# Patient Record
Sex: Female | Born: 1945 | Race: White | Hispanic: No | Smoking: Never smoker
Health system: Southern US, Community
[De-identification: ages and names within clinical notes are randomized; demographics above are authoritative.]

## PROBLEM LIST (undated history)

## (undated) DIAGNOSIS — K219 Gastro-esophageal reflux disease without esophagitis: Secondary | ICD-10-CM

## (undated) DIAGNOSIS — I10 Essential (primary) hypertension: Secondary | ICD-10-CM

## (undated) DIAGNOSIS — E785 Hyperlipidemia, unspecified: Secondary | ICD-10-CM

## (undated) HISTORY — PX: TONSILECTOMY/ADENOIDECTOMY WITH MYRINGOTOMY: SHX6125

## (undated) HISTORY — PX: ABDOMINAL HYSTERECTOMY: SHX81

## (undated) HISTORY — DX: Hyperlipidemia, unspecified: E78.5

## (undated) HISTORY — PX: CHOLECYSTECTOMY, LAPAROSCOPIC: SHX56

## (undated) HISTORY — DX: Essential (primary) hypertension: I10

## (undated) HISTORY — DX: Gastro-esophageal reflux disease without esophagitis: K21.9

---

## 2006-10-16 ENCOUNTER — Encounter: Admission: RE | Admit: 2006-10-16 | Discharge: 2006-10-16 | Payer: Self-pay | Admitting: *Deleted

## 2006-10-26 ENCOUNTER — Encounter: Admission: RE | Admit: 2006-10-26 | Discharge: 2006-10-26 | Payer: Self-pay

## 2007-05-16 ENCOUNTER — Encounter: Admission: RE | Admit: 2007-05-16 | Discharge: 2007-05-16 | Payer: Self-pay | Admitting: *Deleted

## 2007-05-26 ENCOUNTER — Encounter: Admission: RE | Admit: 2007-05-26 | Discharge: 2007-05-26 | Payer: Self-pay | Admitting: *Deleted

## 2008-08-05 ENCOUNTER — Encounter: Admission: RE | Admit: 2008-08-05 | Discharge: 2008-08-05 | Payer: Self-pay | Admitting: Family Medicine

## 2009-09-22 ENCOUNTER — Encounter: Admission: RE | Admit: 2009-09-22 | Discharge: 2009-09-22 | Payer: Self-pay | Admitting: Family Medicine

## 2010-04-19 ENCOUNTER — Encounter: Payer: Self-pay | Admitting: *Deleted

## 2010-10-14 ENCOUNTER — Other Ambulatory Visit: Payer: Self-pay | Admitting: Family Medicine

## 2010-10-14 DIAGNOSIS — Z1231 Encounter for screening mammogram for malignant neoplasm of breast: Secondary | ICD-10-CM

## 2010-10-26 ENCOUNTER — Ambulatory Visit
Admission: RE | Admit: 2010-10-26 | Discharge: 2010-10-26 | Disposition: A | Discharge status: Home / Self Care | Payer: BC Managed Care – PPO | Source: Ambulatory Visit | Attending: Family Medicine | Admitting: Family Medicine

## 2010-10-26 DIAGNOSIS — Z1231 Encounter for screening mammogram for malignant neoplasm of breast: Secondary | ICD-10-CM

## 2011-11-16 ENCOUNTER — Other Ambulatory Visit: Payer: Self-pay | Admitting: Family Medicine

## 2011-11-17 ENCOUNTER — Other Ambulatory Visit: Payer: Self-pay | Admitting: Family Medicine

## 2011-11-17 DIAGNOSIS — Z1231 Encounter for screening mammogram for malignant neoplasm of breast: Secondary | ICD-10-CM

## 2011-12-14 ENCOUNTER — Ambulatory Visit
Admission: RE | Admit: 2011-12-14 | Discharge: 2011-12-14 | Disposition: A | Discharge status: Home / Self Care | Payer: BC Managed Care – PPO | Source: Ambulatory Visit | Attending: Family Medicine | Admitting: Family Medicine

## 2011-12-14 DIAGNOSIS — Z1231 Encounter for screening mammogram for malignant neoplasm of breast: Secondary | ICD-10-CM

## 2012-12-25 ENCOUNTER — Other Ambulatory Visit: Payer: Self-pay

## 2012-12-25 DIAGNOSIS — Z1231 Encounter for screening mammogram for malignant neoplasm of breast: Secondary | ICD-10-CM

## 2013-01-16 ENCOUNTER — Ambulatory Visit
Admission: RE | Admit: 2013-01-16 | Discharge: 2013-01-16 | Disposition: A | Discharge status: Home / Self Care | Payer: BC Managed Care – PPO | Source: Ambulatory Visit

## 2013-01-16 DIAGNOSIS — Z1231 Encounter for screening mammogram for malignant neoplasm of breast: Secondary | ICD-10-CM

## 2013-01-22 ENCOUNTER — Other Ambulatory Visit: Payer: Self-pay | Admitting: Family Medicine

## 2013-01-22 DIAGNOSIS — R928 Other abnormal and inconclusive findings on diagnostic imaging of breast: Secondary | ICD-10-CM

## 2013-02-09 ENCOUNTER — Ambulatory Visit
Admission: RE | Admit: 2013-02-09 | Discharge: 2013-02-09 | Disposition: A | Discharge status: Home / Self Care | Payer: Medicare Other | Source: Ambulatory Visit | Attending: Family Medicine | Admitting: Family Medicine

## 2013-02-09 DIAGNOSIS — R928 Other abnormal and inconclusive findings on diagnostic imaging of breast: Secondary | ICD-10-CM

## 2013-08-14 ENCOUNTER — Other Ambulatory Visit: Payer: Self-pay | Admitting: Family Medicine

## 2013-08-14 DIAGNOSIS — D241 Benign neoplasm of right breast: Secondary | ICD-10-CM

## 2013-08-24 ENCOUNTER — Ambulatory Visit
Admission: RE | Admit: 2013-08-24 | Discharge: 2013-08-24 | Disposition: A | Discharge status: Home / Self Care | Payer: Medicare Other | Source: Ambulatory Visit | Attending: Family Medicine | Admitting: Family Medicine

## 2013-08-24 ENCOUNTER — Encounter (INDEPENDENT_AMBULATORY_CARE_PROVIDER_SITE_OTHER): Payer: Self-pay

## 2013-08-24 DIAGNOSIS — D241 Benign neoplasm of right breast: Secondary | ICD-10-CM

## 2014-01-08 ENCOUNTER — Other Ambulatory Visit: Payer: Self-pay | Admitting: Family Medicine

## 2014-01-08 DIAGNOSIS — R921 Mammographic calcification found on diagnostic imaging of breast: Secondary | ICD-10-CM

## 2014-01-22 ENCOUNTER — Ambulatory Visit
Admission: RE | Admit: 2014-01-22 | Discharge: 2014-01-22 | Disposition: A | Discharge status: Home / Self Care | Payer: Medicare Other | Source: Ambulatory Visit | Attending: Family Medicine | Admitting: Family Medicine

## 2014-01-22 DIAGNOSIS — R921 Mammographic calcification found on diagnostic imaging of breast: Secondary | ICD-10-CM

## 2015-01-14 ENCOUNTER — Other Ambulatory Visit: Payer: Self-pay

## 2015-01-14 DIAGNOSIS — Z1231 Encounter for screening mammogram for malignant neoplasm of breast: Secondary | ICD-10-CM

## 2015-02-10 ENCOUNTER — Ambulatory Visit
Admission: RE | Admit: 2015-02-10 | Discharge: 2015-02-10 | Disposition: A | Discharge status: Home / Self Care | Payer: Medicare Other | Source: Ambulatory Visit

## 2015-02-10 DIAGNOSIS — Z1231 Encounter for screening mammogram for malignant neoplasm of breast: Secondary | ICD-10-CM

## 2016-01-23 ENCOUNTER — Other Ambulatory Visit: Payer: Self-pay | Admitting: Family Medicine

## 2016-01-23 DIAGNOSIS — Z1231 Encounter for screening mammogram for malignant neoplasm of breast: Secondary | ICD-10-CM

## 2016-02-23 ENCOUNTER — Ambulatory Visit
Admission: RE | Admit: 2016-02-23 | Discharge: 2016-02-23 | Disposition: A | Discharge status: Home / Self Care | Payer: Medicare Other | Source: Ambulatory Visit | Attending: Family Medicine | Admitting: Family Medicine

## 2016-02-23 DIAGNOSIS — Z1231 Encounter for screening mammogram for malignant neoplasm of breast: Secondary | ICD-10-CM

## 2017-01-20 ENCOUNTER — Other Ambulatory Visit: Payer: Self-pay | Admitting: Family Medicine

## 2017-01-20 DIAGNOSIS — Z1231 Encounter for screening mammogram for malignant neoplasm of breast: Secondary | ICD-10-CM

## 2017-03-02 ENCOUNTER — Ambulatory Visit
Admission: RE | Admit: 2017-03-02 | Discharge: 2017-03-02 | Disposition: A | Discharge status: Home / Self Care | Payer: Medicare Other | Source: Ambulatory Visit | Attending: Family Medicine | Admitting: Family Medicine

## 2017-03-02 DIAGNOSIS — Z1231 Encounter for screening mammogram for malignant neoplasm of breast: Secondary | ICD-10-CM

## 2018-02-01 ENCOUNTER — Other Ambulatory Visit: Payer: Self-pay | Admitting: Family Medicine

## 2018-02-01 DIAGNOSIS — Z1231 Encounter for screening mammogram for malignant neoplasm of breast: Secondary | ICD-10-CM

## 2018-02-13 ENCOUNTER — Other Ambulatory Visit: Payer: Self-pay | Admitting: Family Medicine

## 2018-02-13 DIAGNOSIS — N644 Mastodynia: Secondary | ICD-10-CM

## 2018-03-06 ENCOUNTER — Ambulatory Visit: Payer: Medicare Other

## 2018-03-06 ENCOUNTER — Ambulatory Visit
Admission: RE | Admit: 2018-03-06 | Discharge: 2018-03-06 | Disposition: A | Discharge status: Home / Self Care | Payer: Medicare Other | Source: Ambulatory Visit | Attending: Family Medicine | Admitting: Family Medicine

## 2018-03-06 DIAGNOSIS — N644 Mastodynia: Secondary | ICD-10-CM

## 2019-04-09 ENCOUNTER — Other Ambulatory Visit: Payer: Self-pay | Admitting: Family Medicine

## 2019-04-09 DIAGNOSIS — Z1231 Encounter for screening mammogram for malignant neoplasm of breast: Secondary | ICD-10-CM

## 2019-04-10 ENCOUNTER — Other Ambulatory Visit: Payer: Self-pay

## 2019-04-10 ENCOUNTER — Ambulatory Visit
Admission: RE | Admit: 2019-04-10 | Discharge: 2019-04-10 | Disposition: A | Discharge status: Home / Self Care | Payer: Medicare Other | Source: Ambulatory Visit | Attending: Family Medicine | Admitting: Family Medicine

## 2019-04-10 DIAGNOSIS — Z1231 Encounter for screening mammogram for malignant neoplasm of breast: Secondary | ICD-10-CM

## 2020-10-16 ENCOUNTER — Other Ambulatory Visit: Payer: Self-pay | Admitting: Family Medicine

## 2020-10-16 DIAGNOSIS — Z1231 Encounter for screening mammogram for malignant neoplasm of breast: Secondary | ICD-10-CM

## 2021-01-14 ENCOUNTER — Ambulatory Visit
Admission: RE | Admit: 2021-01-14 | Discharge: 2021-01-14 | Disposition: A | Discharge status: Home / Self Care | Payer: Medicare Other | Source: Ambulatory Visit | Attending: Family Medicine | Admitting: Family Medicine

## 2021-01-14 ENCOUNTER — Other Ambulatory Visit: Payer: Self-pay

## 2021-01-14 DIAGNOSIS — Z1231 Encounter for screening mammogram for malignant neoplasm of breast: Secondary | ICD-10-CM

## 2021-12-22 ENCOUNTER — Encounter: Payer: Self-pay | Admitting: Gastroenterology

## 2021-12-22 ENCOUNTER — Ambulatory Visit (INDEPENDENT_AMBULATORY_CARE_PROVIDER_SITE_OTHER): Payer: Medicare Other | Admitting: Gastroenterology

## 2021-12-22 VITALS — BP 122/70 | HR 71 | Ht 65.5 in | Wt 162.6 lb

## 2021-12-22 DIAGNOSIS — Z1211 Encounter for screening for malignant neoplasm of colon: Secondary | ICD-10-CM | POA: Diagnosis not present

## 2021-12-22 DIAGNOSIS — Z1212 Encounter for screening for malignant neoplasm of rectum: Secondary | ICD-10-CM

## 2021-12-22 DIAGNOSIS — R151 Fecal smearing: Secondary | ICD-10-CM | POA: Diagnosis not present

## 2021-12-22 DIAGNOSIS — R1310 Dysphagia, unspecified: Secondary | ICD-10-CM | POA: Diagnosis not present

## 2021-12-22 DIAGNOSIS — K219 Gastro-esophageal reflux disease without esophagitis: Secondary | ICD-10-CM

## 2021-12-22 MED ORDER — NA SULFATE-K SULFATE-MG SULF 17.5-3.13-1.6 GM/177ML PO SOLN: 1.0000 | Freq: Once | ORAL | 0 refills | Status: AC

## 2021-12-22 NOTE — Patient Instructions (Signed)
If you are age 74 or older, your body mass index should be between 23-30. Your Body mass index is 26.65 kg/m. If this is out of the aforementioned range listed, please consider follow up with your Primary Care Provider.  If you are age 89 or younger, your body mass index should be between 19-25. Your Body mass index is 26.65 kg/m. If this is out of the aformentioned range listed, please consider follow up with your Primary Care Provider.   Please purchase Metamucil over the counter. Take as directed.   You have been scheduled for an endoscopy and colonoscopy. Please follow the written instructions given to you at your visit today. Please pick up your prep supplies at the pharmacy within the next 1-3 days. If you use inhalers (even only as needed), please bring them with you on the day of your procedure.   The [facility] providers would like to encourage you to use [facility] to communicate with providers for non-urgent requests or questions.  Due to long hold times on the telephone, sending your provider a message by [facility] may be a faster and more efficient way to get a response.  Please allow 48 business hours for a response.  Please remember that this is for non-urgent requests.   It was a pleasure to see you today!  Thank you for trusting me with your gastrointestinal care!    Scott E.cunningham

## 2021-12-22 NOTE — Progress Notes (Unsigned)
HPI : Christine Rhodes is a very pleasant 76 year old female with a history of hypertension and hyperlipidemia who is referred to [place] by Dr. Earney Mallet for further evaluation of dysphagia.  Patient states that she has been having dysphagia for about 2 to 3 years now.  She describes her dysphagia as episodes of coughing and choking when she swallows.  This is noticed more so with liquids than solids.  She denies a sensation of food getting stuck in her chest and having to wash it down.  She denies any episodes of feeling that food was stuck and having to forcefully vomit back up. She underwent a swallow evaluation in 2020 which showed mild penetration with thin liquids, mild delay in laryngeal closure and duration of elevation and mild residue on tongue base and posterior pharyngeal wall.  She was given exercises to perform to improve her swallowing, but she admits she was not very diligent about doing these exercises. She also has a history of chronic typical GERD symptoms which are well controlled with once daily omeprazole.  She states that if she misses a dose she will have significant heartburn.  She has been on omeprazole for about 20 years.  She describes regurgitation events at night, resulting in aspiration-like symptoms (severe coughing, unable to breathe).  She has noticed this particularly when she sleeps on her right side, she does not she does not sleep on her right side anymore.  She reports having 2 colonoscopies in the past, at age 89 and again at age 29.  These colonoscopies were performed in [facility] by Dr. Docia Furl and Dr. Leta Speller and were normal per the patient.  She tells me she was told she did not need any further colon cancer screening.  She reports having regular bowel movements, typically 2-3 stools per day.  Stools are typically formed, sometimes soft.  No problems with diarrhea, constipation or straining.  No blood in the stool.  She does have issues with seepage sometimes.   This is manifested by the finding of small amounts of liquid stool in her underwear.   Past Medical History:  Diagnosis Date   GERD (gastroesophageal reflux disease)    Hyperlipidemia    Hypertension      Past Surgical History:  Procedure Laterality Date   ABDOMINAL HYSTERECTOMY     CHOLECYSTECTOMY, LAPAROSCOPIC     TONSILECTOMY/ADENOIDECTOMY WITH MYRINGOTOMY     Family History  Problem Relation Age of Onset   Esophageal cancer Brother    Colon cancer Neg Hx    Liver cancer Neg Hx    Pancreatic cancer Neg Hx    Rectal cancer Neg Hx    Stomach cancer Neg Hx    Social History   Tobacco Use   Smoking status: Never   Smokeless tobacco: Never  Substance Use Topics   Alcohol use: Never   Drug use: Never   Current Outpatient Medications  Medication Sig Dispense Refill   aspirin 81 MG chewable tablet Chew by mouth daily.     atorvastatin (LIPITOR) 20 MG tablet Take 20 mg by mouth daily.     lisinopril (ZESTRIL) 20 MG tablet Take 20 mg by mouth daily.     omeprazole (PRILOSEC) 40 MG capsule Take 40 mg by mouth daily.     VITAMIN D, CHOLECALCIFEROL, PO Take 1 tablet by mouth daily.     No current facility-administered medications for this visit.   Allergies  Allergen Reactions   Bactrim [Sulfamethoxazole-Trimethoprim]     Pt reports  that she feels like her throat closes up   Penicillins Hives   Codeine Rash     Review of Systems: All systems reviewed and negative except where noted in HPI.    No results found.  Physical Exam: BP 122/70 (BP Location: Left Arm, Patient Position: Sitting, Cuff Size: Normal)   Pulse 71   Ht 5' 5.5" (1.664 m)   Wt 162 lb 9.6 oz (73.8 kg)   SpO2 98%   BMI 26.65 kg/m  Constitutional: Pleasant,well-developed, Caucasian female in no acute distress. HEENT: Normocephalic and atraumatic. Conjunctivae are normal. No scleral icterus. Neck supple.  Cardiovascular: Normal rate, regular rhythm.  Pulmonary/chest: Effort normal and breath  sounds normal. No wheezing, rales or rhonchi. Abdominal: Soft, nondistended, nontender. Bowel sounds active throughout. There are no masses palpable. No hepatomegaly. Extremities: no edema Lymphadenopathy: No cervical adenopathy noted. Neurological: Alert and oriented to person place and time. Skin: Skin is warm and dry. No rashes noted. Psychiatric: Normal mood and affect. Behavior is normal.  CBC No results found for: "WBC", "RBC", "HGB", "HCT", "PLT", "MCV", "MCH", "MCHC", "RDW", "LYMPHSABS", "MONOABS", "EOSABS", "BASOSABS"  CMP  No results found for: "NA", "K", "CL", "CO2", "GLUCOSE", "BUN", "CREATININE", "CALCIUM", "PROT", "ALBUMIN", "AST", "ALT", "ALKPHOS", "BILITOT", "GFRNONAA", "GFRAA"   ASSESSMENT AND PLAN: 76 year old female with hypertension, hyperlipidemia and chronic GERD symptoms, with 2 to 3 years of dysphagia symptoms.  Her description of dysphagia seems more consistent with an oropharyngeal dysphagia rather than an esophageal dysphagia.  She has already undergone a swallow evaluation, and had some mild abnormalities that would probably explain her symptoms (penetration with thin liquids).  She was given exercises to perform, but admits that she has not been diligent about these exercises.  I do not think any further evaluation of this dysphagia is needed at this point. She does have chronic GERD symptoms which are mostly well controlled with once daily Prilosec, which turned if she misses even 1 dose.  She has never had an upper endoscopy.  Given the chronicity of her GERD symptoms and her dysphagia (even though it seems most consistent with oropharyngeal) I think an upper endoscopy is reasonable to assess her anatomy and for complications of GERD, and perform dilation if a ring or stricture is present. The patient is also overdue for a screening colonoscopy.  Her last colonoscopy was 12 years ago and was reportedly normal by the patient.  Although she was told that she did not  need any further colon cancer screening, the patient does not have any significant cardiopulmonary comorbidities and has very good functional status.  She has a reasonable life expectancy of 10 years or more.  I feel that she would be a good candidate for 1 more screening colonoscopy.  If this one is normal, then I would recommend against further colon cancer screening after that.  This is also in accordance with guidelines which suggest screening until age 51 for most patients. The patient has symptoms of fecal seepage.  This is most likely related to age-related atrophy and weakness of sphincter muscle.  She does not have other symptoms such as hemorrhoid prolapse.  I recommended she start taking Metamucil on a daily basis.  We will assess for other abnormalities such as prolapsing hemorrhoids at the time of her colonoscopy.   CRC screening -Colonoscopy  GERD - Omeprazole daily - EGD  Dysphagia, more consistent with oropharyngeal dysphagia - EGD - Recommended patient perform swallowing exercises as previously recommended by speech path  Fecal seepage -  Metamucil   The details, risks (including bleeding, perforation, infection, missed lesions, medication reactions and possible hospitalization or surgery if complications occur), benefits, and alternatives to EGD/colonoscopy with possible biopsy and possible polypectomy were discussed with the patient and she consents to proceed.    Earney Mallet, MD

## 2021-12-28 ENCOUNTER — Telehealth: Payer: Self-pay | Admitting: Gastroenterology

## 2021-12-28 ENCOUNTER — Other Ambulatory Visit: Payer: Self-pay

## 2021-12-28 MED ORDER — NA SULFATE-K SULFATE-MG SULF 17.5-3.13-1.6 GM/177ML PO SOLN: 1.0000 | Freq: Once | ORAL | 0 refills | Status: AC

## 2021-12-28 NOTE — Telephone Encounter (Signed)
Patient called regarding her prep medication, states she went to the pharmacy and they did not have the order. Please send it and call her once done so she knows.

## 2021-12-28 NOTE — Telephone Encounter (Signed)
Left VM for patient to return my call

## 2021-12-29 ENCOUNTER — Other Ambulatory Visit: Payer: Self-pay

## 2021-12-29 MED ORDER — NA SULFATE-K SULFATE-MG SULF 17.5-3.13-1.6 GM/177ML PO SOLN: 1.0000 | Freq: Once | ORAL | 0 refills | Status: AC

## 2021-12-29 NOTE — Telephone Encounter (Signed)
Suprep has been sent to [facility] in [place] , [place] per patient request.

## 2021-12-29 NOTE — Telephone Encounter (Signed)
Inbound call from patient stating that she would like her prep sent to [facility] in [place]. Please advise  Pharmacy Number : [phone]

## 2022-01-14 ENCOUNTER — Ambulatory Visit (AMBULATORY_SURGERY_CENTER): Payer: Medicare Other | Admitting: Gastroenterology

## 2022-01-14 ENCOUNTER — Encounter: Payer: Self-pay | Admitting: Gastroenterology

## 2022-01-14 VITALS — BP 109/86 | HR 62 | Temp 97.5°F | Resp 12 | Ht 65.5 in | Wt 162.0 lb

## 2022-01-14 DIAGNOSIS — K219 Gastro-esophageal reflux disease without esophagitis: Secondary | ICD-10-CM

## 2022-01-14 DIAGNOSIS — K317 Polyp of stomach and duodenum: Secondary | ICD-10-CM

## 2022-01-14 DIAGNOSIS — Z1211 Encounter for screening for malignant neoplasm of colon: Secondary | ICD-10-CM

## 2022-01-14 DIAGNOSIS — K449 Diaphragmatic hernia without obstruction or gangrene: Secondary | ICD-10-CM

## 2022-01-14 DIAGNOSIS — Z1212 Encounter for screening for malignant neoplasm of rectum: Secondary | ICD-10-CM | POA: Diagnosis not present

## 2022-01-14 DIAGNOSIS — R1310 Dysphagia, unspecified: Secondary | ICD-10-CM

## 2022-01-14 MED ORDER — SODIUM CHLORIDE 0.9 % IV SOLN: 500.0000 mL | Freq: Once | INTRAVENOUS | Status: DC

## 2022-01-14 NOTE — Progress Notes (Signed)
History and Physical Interval Note:  01/14/2022 3:42 PM  Christine Rhodes  has presented today for endoscopic procedure(s), with the diagnosis of  Encounter Diagnoses  Name Primary?   Dysphagia, unspecified type Yes   Gastroesophageal reflux disease, unspecified whether esophagitis present    Screening for colorectal cancer   .  The various methods of evaluation and treatment have been discussed with the patient and/or family. After consideration of risks, benefits and other options for treatment, the patient has consented to  the endoscopic procedure(s).   The patient's history has been reviewed, patient examined, no change in status, stable for endoscopic procedure(s).  I have reviewed the patient's chart and labs.  Questions were answered to the patient's satisfaction.     Oralee Rapaport E. Candis Schatz, MD [facility]

## 2022-01-14 NOTE — Progress Notes (Signed)
Called to room to assist during endoscopic procedure.  Patient ID and intended procedure confirmed with present staff. Received instructions for my participation in the procedure from the performing physician.  

## 2022-01-14 NOTE — Progress Notes (Signed)
Pt in recovery with monitors in place, VSS. Report given to receiving RN. Bite guard was placed with pt awake to ensure comfort. No dental or soft tissue damage noted. 

## 2022-01-14 NOTE — Op Note (Signed)
[facility] Patient Name: Christine Rhodes Procedure Date: 01/14/2022 3:47 PM MRN: [id number] Endoscopist: Nicki Reaper E. Candis Schatz , MD Age: 76 Referring MD:  Date of Birth: 02-Apr-1945 Gender: Female Account #: [id number] Procedure:                Colonoscopy Indications:              Screening for colorectal malignant neoplasm (last                            colonoscopy was more than 10 years ago) Medicines:                Monitored Anesthesia Care Procedure:                Pre-Anesthesia Assessment:                           - Prior to the procedure, a History and Physical                            was performed, and patient medications and                            allergies were reviewed. The patient's tolerance of                            previous anesthesia was also reviewed. The risks                            and benefits of the procedure and the sedation                            options and risks were discussed with the patient.                            All questions were answered, and informed consent                            was obtained. Prior Anticoagulants: The patient has                            taken no previous anticoagulant or antiplatelet                            agents. ASA Grade Assessment: II - A patient with                            mild systemic disease. After reviewing the risks                            and benefits, the patient was deemed in                            satisfactory condition to undergo the procedure.  After obtaining informed consent, the colonoscope                            was passed under direct vision. Throughout the                            procedure, the patient's blood pressure, pulse, and                            oxygen saturations were monitored continuously. The                            CF [id number] #[id number] was introduced through the anus                            and advanced to  the the cecum, identified by                            appendiceal orifice and ileocecal valve. The                            colonoscopy was somewhat difficult due to                            significant looping and a tortuous colon.                            Successful completion of the procedure was aided by                            using manual pressure. The patient tolerated the                            procedure well. The quality of the bowel                            preparation was excellent. The ileocecal valve,                            appendiceal orifice, and rectum were photographed.                            The bowel preparation used was SUPREP via split                            dose instruction. Scope In: 4:03:23 PM Scope Out: 4:20:13 PM Total Procedure Duration: 0 hours 16 minutes 50 seconds  Findings:                 The perianal and digital rectal examinations were                            normal. Pertinent negatives include normal  sphincter tone and no palpable rectal lesions.                           A single small angiodysplastic lesion without                            bleeding was found in the ascending colon.                           Multiple small and large-mouthed diverticula were                            found in the sigmoid colon, descending colon and                            transverse colon. There was no evidence of                            diverticular bleeding.                           The exam was otherwise normal throughout the                            examined colon.                           The retroflexed view of the distal rectum and anal                            verge was normal and showed no anal or rectal                            abnormalities. Complications:            No immediate complications. Estimated Blood Loss:     Estimated blood loss: none. Impression:               - A single  non-bleeding colonic angiodysplastic                            lesion.                           - Moderate diverticulosis in the sigmoid colon, in                            the descending colon and in the transverse colon.                            There was no evidence of diverticular bleeding.                           - The distal rectum and anal verge are normal on  retroflexion view.                           - No specimens collected. Recommendation:           - Patient has a contact number available for                            emergencies. The signs and symptoms of potential                            delayed complications were discussed with the                            patient. Return to normal activities tomorrow.                            Written discharge instructions were provided to the                            patient.                           - Resume previous diet.                           - Continue present medications.                           - Await pathology results.                           - Given age and lack of polyps, I recommend against                            any further colon cancer screening. Quashaun Lazalde E. Candis Schatz, MD 01/14/2022 4:40:20 PM This report has been signed electronically.

## 2022-01-14 NOTE — Patient Instructions (Addendum)
Please read handouts provided. Continue present medications, including once daily PPI. Await pathology results. Recommendation for no further colon cancer screening. Follow-up with speech pathology.  YOU HAD AN ENDOSCOPIC PROCEDURE TODAY AT [facility]:   Refer to the procedure report that was given to you for any specific questions about what was found during the examination.  If the procedure report does not answer your questions, please call your gastroenterologist to clarify.  If you requested that your care partner not be given the details of your procedure findings, then the procedure report has been included in a sealed envelope for you to review at your convenience later.  YOU SHOULD EXPECT: Some feelings of bloating in the abdomen. Passage of more gas than usual.  Walking can help get rid of the air that was put into your GI tract during the procedure and reduce the bloating. If you had a lower endoscopy (such as a colonoscopy or flexible sigmoidoscopy) you may notice spotting of blood in your stool or on the toilet paper. If you underwent a bowel prep for your procedure, you may not have a normal bowel movement for a few days.  Please Note:  You might notice some irritation and congestion in your nose or some drainage.  This is from the oxygen used during your procedure.  There is no need for concern and it should clear up in a day or so.  SYMPTOMS TO REPORT IMMEDIATELY:  Following lower endoscopy (colonoscopy or flexible sigmoidoscopy):  Excessive amounts of blood in the stool  Significant tenderness or worsening of abdominal pains  Swelling of the abdomen that is new, acute  Fever of 100F or higher  Following upper endoscopy (EGD)  Vomiting of blood or coffee ground material  New chest pain or pain under the shoulder blades  Painful or persistently difficult swallowing  New shortness of breath  Fever of 100F or higher  Black, tarry-looking stools  For urgent  or emergent issues, a gastroenterologist can be reached at any hour by calling [phone]. Do not use MyChart messaging for urgent concerns.    DIET:  We do recommend a small meal at first, but then you may proceed to your regular diet.  Drink plenty of fluids but you should avoid alcoholic beverages for 24 hours.  ACTIVITY:  You should plan to take it easy for the rest of today and you should NOT DRIVE or use heavy machinery until tomorrow (because of the sedation medicines used during the test).    FOLLOW UP: Our staff will call the number listed on your records the next business day following your procedure.  We will call around 7:15- 8:00 am to check on you and address any questions or concerns that you may have regarding the information given to you following your procedure. If we do not reach you, we will leave a message.     If any biopsies were taken you will be contacted by phone or by letter within the next 1-3 weeks.  Please call us at [phone] if you have not heard about the biopsies in 3 weeks.    SIGNATURES/CONFIDENTIALITY: You and/or your care partner have signed paperwork which will be entered into your electronic medical record.  These signatures attest to the fact that that the information above on your After Visit Summary has been reviewed and is understood.  Full responsibility of the confidentiality of this discharge information lies with you and/or your care-partner.

## 2022-01-14 NOTE — Op Note (Signed)
[name] Patient Name: Christine Rhodes Procedure Date: 01/14/2022 3:48 PM MRN: [id number] Endoscopist: Nicki Reaper E. Candis Schatz , MD Age: 76 Referring MD:  Date of Birth: 26-May-1945 Gender: Female Account #: [id number] Procedure:                Upper GI endoscopy Indications:              Dysphagia, Gastro-esophageal reflux disease Medicines:                Monitored Anesthesia Care Procedure:                Pre-Anesthesia Assessment:                           - Prior to the procedure, a History and Physical                            was performed, and patient medications and                            allergies were reviewed. The patient's tolerance of                            previous anesthesia was also reviewed. The risks                            and benefits of the procedure and the sedation                            options and risks were discussed with the patient.                            All questions were answered, and informed consent                            was obtained. Prior Anticoagulants: The patient has                            taken no previous anticoagulant or antiplatelet                            agents. ASA Grade Assessment: II - A patient with                            mild systemic disease. After reviewing the risks                            and benefits, the patient was deemed in                            satisfactory condition to undergo the procedure.                           After obtaining informed consent, the endoscope was  passed under direct vision. Throughout the                            procedure, the patient's blood pressure, pulse, and                            oxygen saturations were monitored continuously. The                            Endoscope was introduced through the mouth, and                            advanced to the third part of duodenum. The upper                            GI  endoscopy was accomplished without difficulty.                            The patient tolerated the procedure well. Scope In: Scope Out: Findings:                 The examined portions of the nasopharynx,                            oropharynx and larynx were normal.                           The Z-line was irregular.                           The exam of the esophagus was otherwise normal.                           A 3 cm hiatal hernia was present.                           A few 2 to 10 mm sessile polyps were found in the                            gastric body. The polyp was removed with a cold                            snare. Resection and retrieval were complete.                            Estimated blood loss was minimal.                           The exam of the stomach was otherwise normal.                           The examined duodenum was normal. Complications:            No immediate complications. Estimated Blood Loss:     Estimated blood loss was minimal. Impression:               -  The examined portions of the nasopharynx,                            oropharynx and larynx were normal.                           - Z-line irregular, but not meeting criteria for                            Barrett's esophagus.                           - 3 cm hiatal hernia.                           - A few gastric polyps. Resected and retrieved.                           - Normal examined duodenum.                           - No endoscopic abnormalities to explain dysphagia. Recommendation:           - Patient has a contact number available for                            emergencies. The signs and symptoms of potential                            delayed complications were discussed with the                            patient. Return to normal activities tomorrow.                            Written discharge instructions were provided to the                            patient.                            - Resume previous diet.                           - Continue present medications, including once                            daily PPI.                           - Await pathology results.                           - Follow up with speech pathology for further                            management of dysphagia symptoms. Christine Rhodes E. Candis Schatz,  MD 01/14/2022 4:29:20 PM This report has been signed electronically.

## 2022-01-14 NOTE — Progress Notes (Signed)
VS completed by DT.  Pt's states no medical or surgical changes since previsit or office visit.  

## 2022-01-15 ENCOUNTER — Telehealth: Payer: Self-pay | Admitting: *Deleted

## 2022-01-15 NOTE — Telephone Encounter (Signed)
  Follow up Call-     01/14/2022    2:38 PM  Call back number  Post procedure Call Back phone  # [phone]  Permission to leave phone message Yes     Patient questions:  Do you have a fever, pain , or abdominal swelling? No. Pain Score  0 *  Have you tolerated food without any problems? Yes.    Have you been able to return to your normal activities? Yes.    Do you have any questions about your discharge instructions: Diet   No. Medications  No. Follow up visit  No.  Do you have questions or concerns about your Care? No.  Actions: * If pain score is 4 or above: No action needed, pain <4.

## 2022-01-20 ENCOUNTER — Encounter: Payer: Self-pay | Admitting: Gastroenterology

## 2023-10-19 IMAGING — MG MM DIGITAL SCREENING BILAT W/ TOMO AND CAD
8 series · 8 of 24 positions shown · non-contrast
Comparison: Previous exam(s).

CLINICAL DATA: Screening.

EXAM:
DIGITAL SCREENING BILATERAL MAMMOGRAM WITH TOMOSYNTHESIS AND CAD
TECHNIQUE: Bilateral screening digital craniocaudal and mediolateral oblique
mammograms were obtained. Bilateral screening digital breast
tomosynthesis was performed. The images were evaluated with
computer-aided detection.

[L MLO synth-2D]
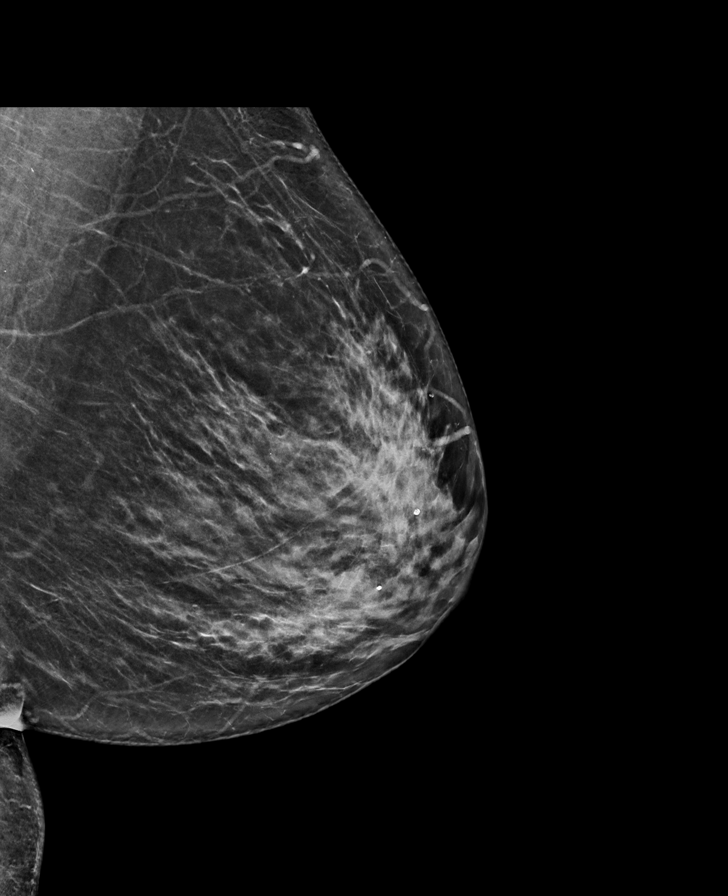

[R MLO synth-2D]
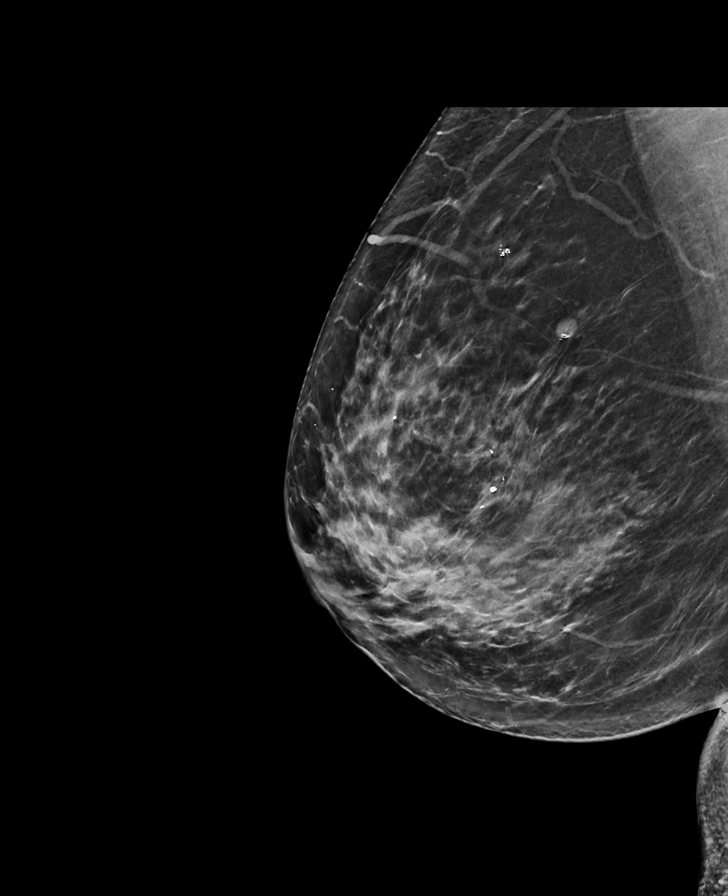

[R CC synth-2D]
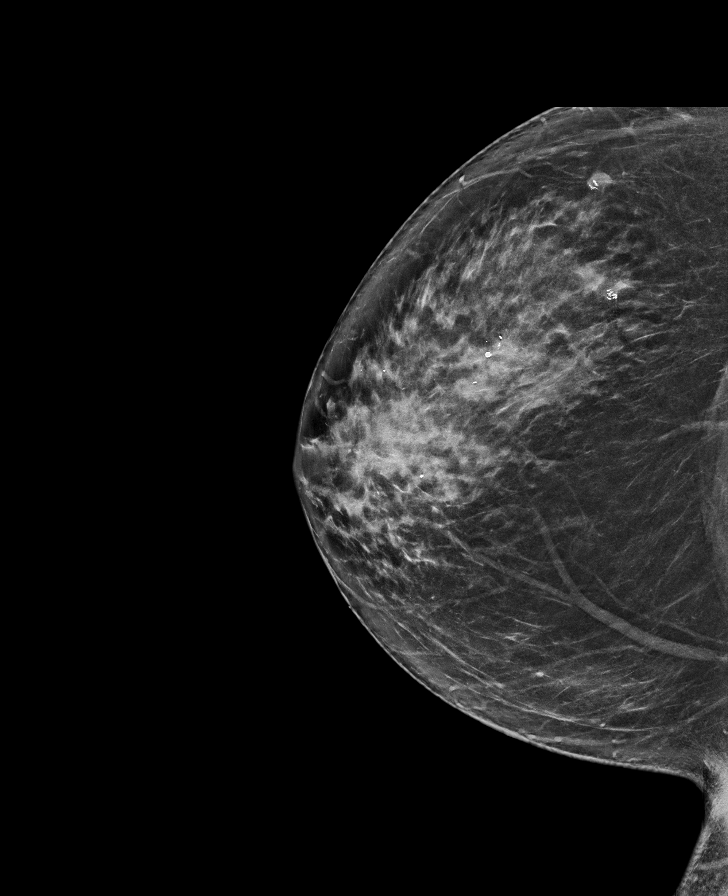

[L CC synth-2D]
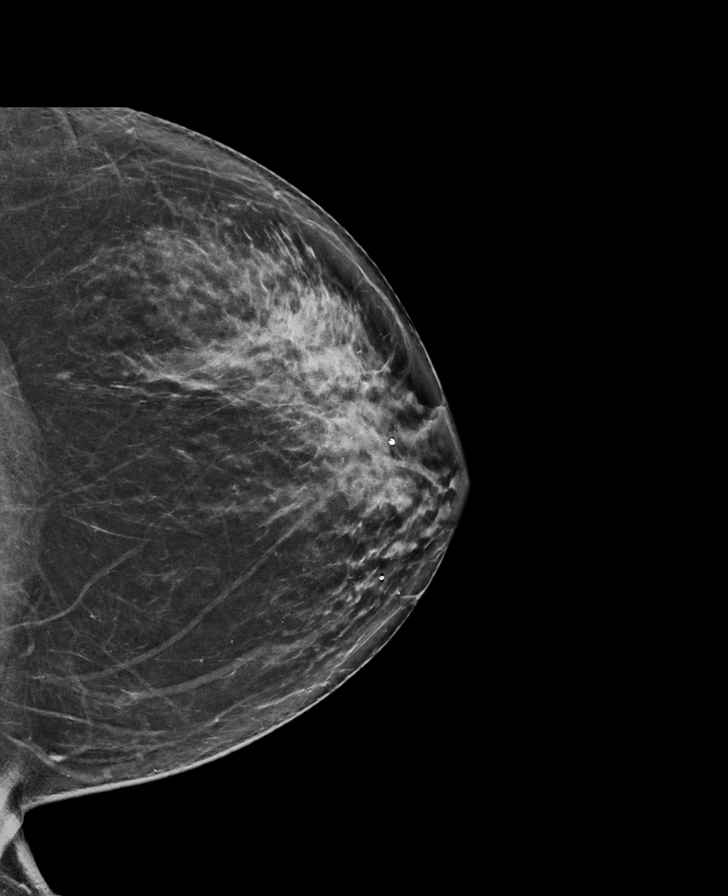

[L MLO tomo · tomo slice 37/74.0]
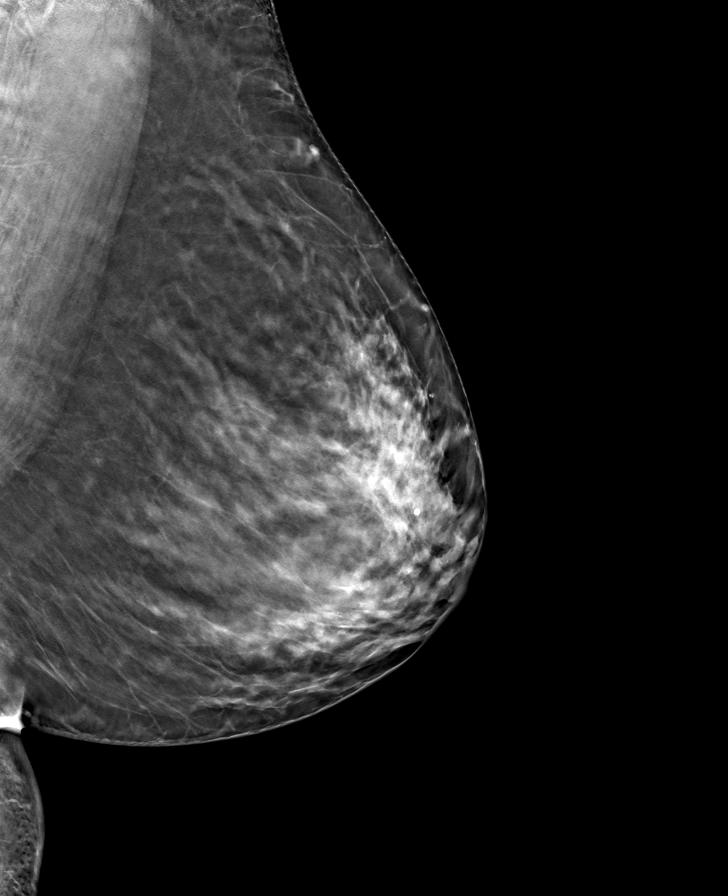

[L CC tomo · tomo slice 39/78.0]
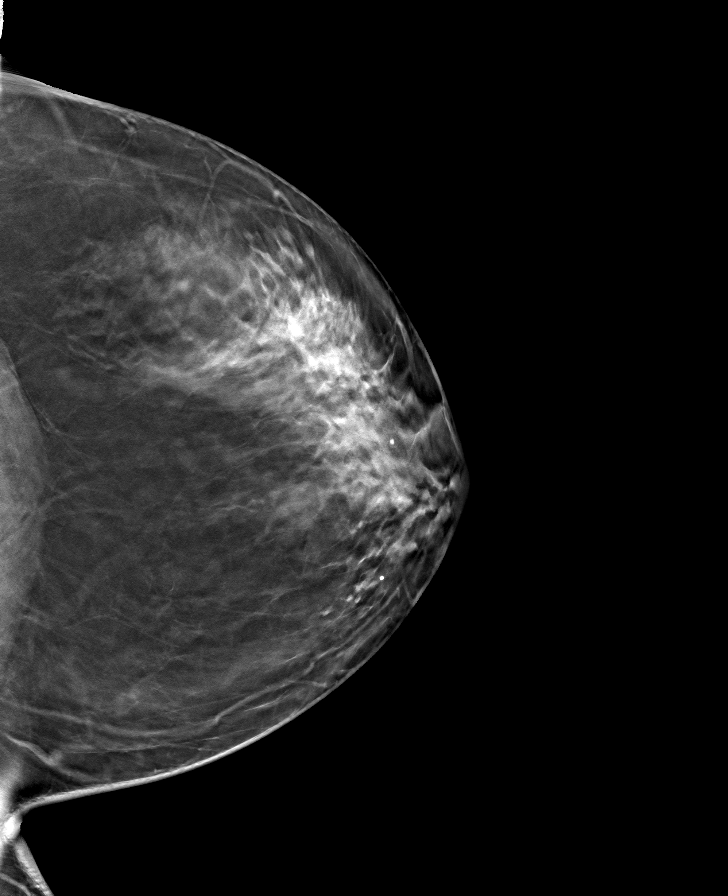

[R MLO tomo · tomo slice 39/76.0]
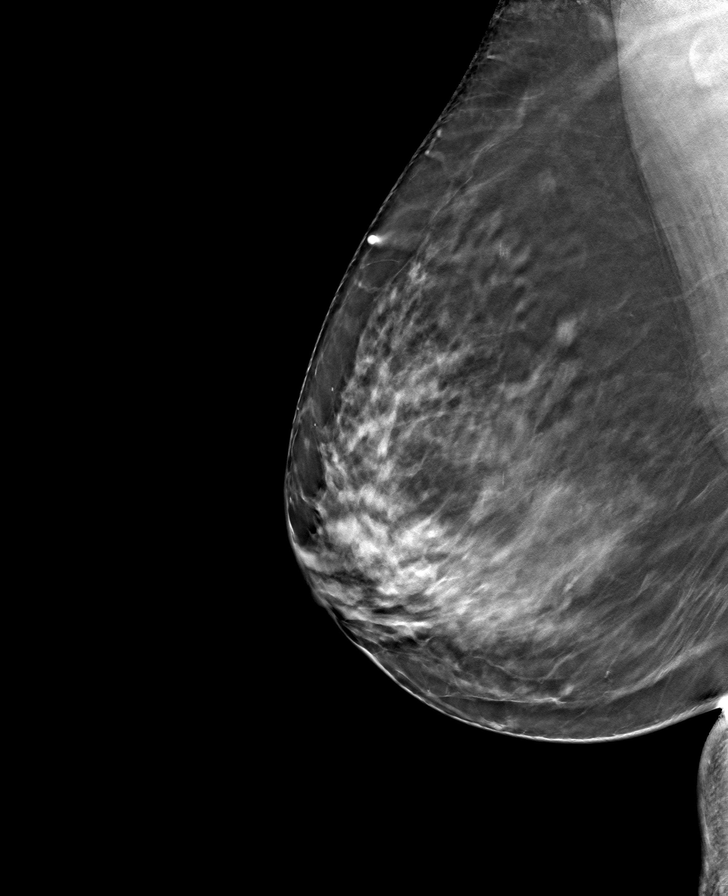

[R CC tomo · tomo slice 39/76.0]
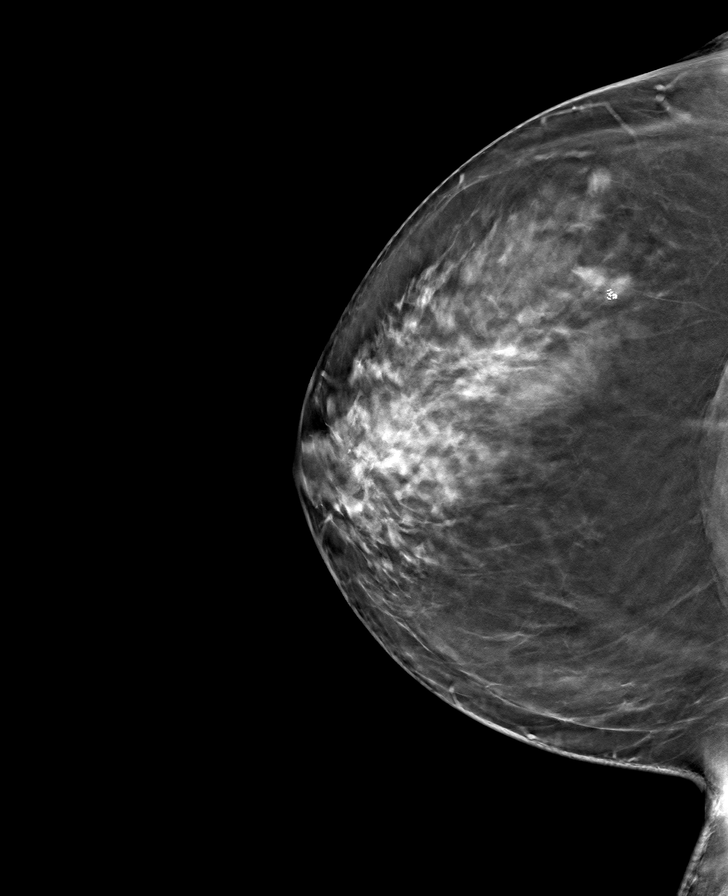

[8 of 24 positions shown; findings below may reference images not displayed]

ACR Breast Density Category c: The breast tissue is heterogeneously
dense, which may obscure small masses.
FINDINGS: There are no findings suspicious for malignancy.
IMPRESSION: No mammographic evidence of malignancy. A result letter of this
screening mammogram will be mailed directly to the patient.

RECOMMENDATION:
Screening mammogram in one year. (Code:Q3-W-BC3)

BI-RADS CATEGORY  1: Negative.

## 2024-07-02 ENCOUNTER — Ambulatory Visit: Admitting: Diagnostic Neuroimaging
